# Patient Record
Sex: Female | Born: 2005
Health system: Southern US, Community
[De-identification: ages and names within clinical notes are randomized; demographics above are authoritative.]

---
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
## 2024-04-02 ENCOUNTER — Ambulatory Visit: Admitting: Family Medicine

## 2024-09-02 ENCOUNTER — Telehealth: Payer: Self-pay | Admitting: Family Medicine

## 2024-09-02 NOTE — Telephone Encounter (Signed)
 Copied from CRM #[id number]. Topic: Appointments - Scheduling Inquiry for Clinic >> Sep 02, 2024  2:12 PM Samantha Sherman wrote: Reason for CRM: Burning urine, Lower abdominal pain, Urgency, only pees a little ( Mom called.Samantha Sherman trying to make an appt for patient - she is in college at the moment ) possible UTI?  She won't be in town til Thursday..  Can we schedule?  Pls call mom.

## 2024-09-02 NOTE — Telephone Encounter (Signed)
 Patient scheduled at 3:05 pm 09/04/2024 with Rosaline Bruns, FNP

## 2024-09-04 ENCOUNTER — Ambulatory Visit: Admitting: Family Medicine

## 2024-09-04 ENCOUNTER — Encounter: Payer: Self-pay | Admitting: Family Medicine

## 2024-09-04 ENCOUNTER — Ambulatory Visit: Payer: Self-pay | Admitting: Family Medicine

## 2024-09-04 VITALS — BP 119/83 | HR 87 | Temp 98.3°F | Ht 60.04 in | Wt 132.4 lb

## 2024-09-04 DIAGNOSIS — N3000 Acute cystitis without hematuria: Secondary | ICD-10-CM

## 2024-09-04 DIAGNOSIS — R300 Dysuria: Secondary | ICD-10-CM

## 2024-09-04 DIAGNOSIS — Z23 Encounter for immunization: Secondary | ICD-10-CM | POA: Diagnosis not present

## 2024-09-04 DIAGNOSIS — Z3009 Encounter for other general counseling and advice on contraception: Secondary | ICD-10-CM | POA: Diagnosis not present

## 2024-09-04 LAB — URINALYSIS, ROUTINE W REFLEX MICROSCOPIC
Bilirubin, UA: NEGATIVE
Glucose, UA: NEGATIVE
Ketones, UA: NEGATIVE
Nitrite, UA: NEGATIVE
Protein,UA: NEGATIVE
RBC, UA: NEGATIVE
Specific Gravity, UA: 1.025 (ref 1.005–1.030)
Urobilinogen, Ur: 0.2 mg/dL (ref 0.2–1.0)
pH, UA: 6 (ref 5.0–7.5)

## 2024-09-04 LAB — MICROSCOPIC EXAMINATION
RBC, Urine: NONE SEEN /HPF (ref 0–2)
Renal Epithel, UA: NONE SEEN /HPF
Yeast, UA: NONE SEEN

## 2024-09-04 LAB — PREGNANCY, URINE: Preg Test, Ur: NEGATIVE

## 2024-09-04 MED ORDER — NITROFURANTOIN MONOHYD MACRO 100 MG PO CAPS
100.0000 mg | ORAL_CAPSULE | Freq: Two times a day (BID) | ORAL | 0 refills | Status: AC
Start: 2024-09-04 — End: 2024-09-11

## 2024-09-04 NOTE — Progress Notes (Signed)
 Subjective:  Patient ID: Samantha Sherman, female    DOB: 09/10/2006, 18 y.o.   MRN: [id number]  Patient Care Team: [place] HERO, FNP as PCP - General (Family Medicine)   Chief Complaint:  Dysuria (X 1 week ) and urine odor  (X 1 week )   HPI: Samantha Sherman is a 18 y.o. female presenting on 09/04/2024 for Dysuria (X 1 week ) and urine odor  (X 1 week )   Samantha Sherman is an 18 year old female who presents with urinary symptoms.  She has been experiencing urinary symptoms for a little over a week, including dysuria and a sensation of incomplete bladder emptying. Additionally, she notes malodorous urine. She has not attempted any home remedies such as increasing water intake or consuming cranberry juice.  She has a history of abnormal and heavy menstrual cycles, which may be relevant to her contraceptive needs.  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  No fevers, chills, weakness, confusion, or nausea.       Relevant past medical, surgical, family, and social history reviewed and updated as indicated.  Allergies and medications reviewed and updated. Data reviewed: Chart in Epic.   History reviewed. No pertinent past medical history.  History reviewed. No pertinent surgical history.  Social History   Socioeconomic History   Marital status: Single    Spouse name: Not on file   Number of children: Not on file   Years of education: Not on file   Highest education level: 12th grade  Occupational History   Not on file  Tobacco Use   Smoking status: Never   Smokeless tobacco: Never  Vaping Use   Vaping status: Never Used  Substance and Sexual Activity   Alcohol use: No   Drug use: No   Sexual activity: Not on file  Other Topics Concern   Not on file  Social History Narrative   ** Merged History Encounter **       Social Drivers of Health   Financial Resource Strain: Patient Declined (10/23/2023)   Overall Financial  Resource Strain (CARDIA)    Difficulty of Paying Living Expenses: Patient declined  Food Insecurity: No Food Insecurity (10/23/2023)   Hunger Vital Sign    Worried About Running Out of Food in the Last Year: Never true    Ran Out of Food in the Last Year: Never true  Recent Concern: Food Insecurity - Food Insecurity Present (08/15/2023)   Hunger Vital Sign    Worried About Running Out of Food in the Last Year: Sometimes true    Ran Out of Food in the Last Year: Never true  Transportation Needs: No Transportation Needs (10/23/2023)   PRAPARE - Administrator, Civil Service (Medical): No    Lack of Transportation (Non-Medical): No  Physical Activity: Insufficiently Active (10/23/2023)   Exercise Vital Sign    Days of Exercise per Week: 4 days    Minutes of Exercise per Session: 20 min  Stress: Stress Concern Present (10/23/2023)   [employer] of Occupational Health - Occupational Stress Questionnaire    Feeling of Stress : To some extent  Social Connections: Unknown (10/23/2023)   Social Connection and Isolation Panel    Frequency of Communication with Friends and Family: More than three times a week    Frequency of Social Gatherings with Friends and Family: More than three times a week    Attends Religious Services: More than 4  times per year    Active Member of Clubs or Organizations: No    Attends Banker Meetings: Never    Marital Status: Patient declined  Intimate Partner Violence: Unknown (03/02/2022)   Received from [facility]   HITS    Physically Hurt: Not on file    Insult or Talk Down To: Not on file    Threaten Physical Harm: Not on file    Scream or Curse: Not on file    Outpatient Encounter Medications as of 09/04/2024  Medication Sig   ibuprofen  (ADVIL ) 200 MG tablet Take 200 mg by mouth every 6 (six) hours as needed.   nitrofurantoin , macrocrystal-monohydrate, (MACROBID ) 100 MG capsule Take 1 capsule (100 mg total) by mouth 2 (two)  times daily for 7 days.   [DISCONTINUED] famotidine  (PEPCID ) 20 MG tablet Take 1 tablet (20 mg total) by mouth 2 (two) times daily.   [DISCONTINUED] norethindrone-ethinyl estradiol-FE (HAILEY FE 1/20) 1-20 MG-MCG tablet TAKE ONE TABLET ONCE DAILY   No facility-administered encounter medications on file as of 09/04/2024.    Allergies  Allergen Reactions   Amoxicillin Rash and Hives   Augmentin [Amoxicillin-Pot Clavulanate] Rash    Pertinent ROS per HPI, otherwise unremarkable      Objective:  BP 119/83   Pulse 87   Temp 98.3 F (36.8 C)   Ht 5' 0.04 (1.525 m)   Wt 132 lb 6.4 oz (60.1 kg)   LMP 08/10/2024 (Approximate)   SpO2 99%   BMI 25.82 kg/m    Wt Readings from Last 3 Encounters:  09/04/24 132 lb 6.4 oz (60.1 kg) (62%, Z= 0.30)*  10/24/23 128 lb 8 oz (58.3 kg) (59%, Z= 0.23)*  08/15/23 126 lb 12.8 oz (57.5 kg) (57%, Z= 0.17)*   * Growth percentiles are based on CDC (Girls, 2-20 Years) data.    Physical Exam Vitals and nursing note reviewed.  Constitutional:      General: She is not in acute distress.    Appearance: Normal appearance. She is well-developed, well-groomed and normal weight. She is not Sherman-appearing, toxic-appearing or diaphoretic.  HENT:     Head: Normocephalic and atraumatic.     Jaw: There is normal jaw occlusion.     Right Ear: Hearing normal.     Left Ear: Hearing normal.     Nose: Nose normal.     Mouth/Throat:     Lips: Pink.     Mouth: Mucous membranes are moist.     Pharynx: Uvula midline.  Eyes:     General: Lids are normal.     Pupils: Pupils are equal, round, and reactive to light.  Neck:     Trachea: Trachea and phonation normal.  Cardiovascular:     Rate and Rhythm: Normal rate and regular rhythm.     Heart sounds: Normal heart sounds.  Pulmonary:     Effort: Pulmonary effort is normal. No respiratory distress.     Breath sounds: Normal breath sounds. No wheezing.  Abdominal:     General: Bowel sounds are normal. There is  no distension or abdominal bruit.     Palpations: Abdomen is soft. There is no hepatomegaly or splenomegaly.     Tenderness: There is no abdominal tenderness. There is no right CVA tenderness or left CVA tenderness.     Hernia: No hernia is present.  Musculoskeletal:        General: Normal range of motion.     Cervical back: Normal range of motion and neck supple.  Right lower leg: No edema.     Left lower leg: No edema.  Skin:    General: Skin is warm and dry.     Capillary Refill: Capillary refill takes less than 2 seconds.     Coloration: Skin is not cyanotic, jaundiced or pale.     Findings: No rash.  Neurological:     General: No focal deficit present.     Mental Status: She is alert and oriented to person, place, and time.     Sensory: Sensation is intact.     Motor: Motor function is intact.     Coordination: Coordination is intact.     Gait: Gait is intact.     Deep Tendon Reflexes: Reflexes are normal and symmetric.  Psychiatric:        Attention and Perception: Attention and perception normal.        Mood and Affect: Mood and affect normal.        Speech: Speech normal.        Behavior: Behavior normal. Behavior is cooperative.        Thought Content: Thought content normal.        Cognition and Memory: Cognition and memory normal.        Judgment: Judgment normal.      Results for orders placed or performed in visit on 09/04/24  Microscopic Examination   Collection Time: 09/04/24  3:05 PM   Urine  Result Value Ref Range   WBC, UA 11-30 (A) 0 - 5 /hpf   RBC, Urine None seen 0 - 2 /hpf   Epithelial Cells (non renal) 0-10 0 - 10 /hpf   Renal Epithel, UA None seen None seen /hpf   Bacteria, UA Few (A) None seen/Few   Yeast, UA None seen None seen  Urinalysis, Routine w reflex microscopic   Collection Time: 09/04/24  3:05 PM  Result Value Ref Range   Specific Gravity, UA 1.025 1.005 - 1.030   pH, UA 6.0 5.0 - 7.5   Color, UA Yellow Yellow   Appearance Ur  Clear Clear   Leukocytes,UA 1+ (A) Negative   Protein,UA Negative Negative/Trace   Glucose, UA Negative Negative   Ketones, UA Negative Negative   RBC, UA Negative Negative   Bilirubin, UA Negative Negative   Urobilinogen, Ur 0.2 0.2 - 1.0 mg/dL   Nitrite, UA Negative Negative   Microscopic Examination See below:        Pertinent labs & imaging results that were available during my care of the patient were reviewed by me and considered in my medical decision making.  Assessment & Plan:  Samantha Sherman was seen today for dysuria and urine odor .  Diagnoses and all orders for this visit:  Acute cystitis without hematuria -     Urine Culture -     Urinalysis, Routine w reflex microscopic -     Microscopic Examination -     nitrofurantoin , macrocrystal-monohydrate, (MACROBID ) 100 MG capsule; Take 1 capsule (100 mg total) by mouth 2 (two) times daily for 7 days.  Dysuria -     Urine Culture -     Urinalysis, Routine w reflex microscopic  Birth control counseling -     Pregnancy, urine       Urinary tract infection Urinary tract infection with dysuria, urgency, lower abdominal pain, and malodorous urine for over a week. No fever, chills, weakness, confusion, or nausea. Symptoms suggestive of a bladder infection. No prior treatment attempted. - Prescribe macrobid  for  urinary tract infection  Birth control management Discussion about birth control options, specifically Mirena IUD and Nexplanon. Consideration of menstrual cycle irregularities and lifestyle preferences. Mirena IUD preferred due to potential for amenorrhea, minimal weight gain, and long-term efficacy of eight years. Informed consent includes discussion of insertion discomfort, cramping, and the need for a negative pregnancy test before insertion. Nexplanon not recommended due to potential for irregular cycles and lifestyle impact. - Schedule Mirena IUD insertion with Dr. Maryanne - Perform pregnancy test today and on the  day of Mirena insertion - Provide information on Mirena IUD - Advise taking two Aleve with food one hour before Mirena insertion - Coordinate scheduling with Dr. Williemae nurse for insertion timing          Continue all other maintenance medications.  Follow up plan: Return if symptoms worsen or fail to improve.   Continue healthy lifestyle choices, including diet (rich in fruits, vegetables, and lean proteins, and low in salt and simple carbohydrates) and exercise (at least 30 minutes of moderate physical activity daily).  Educational handout given for Mirena  The above assessment and management plan was discussed with the patient. The patient verbalized understanding of and has agreed to the management plan. Patient is aware to call the clinic if they develop any new symptoms or if symptoms persist or worsen. Patient is aware when to return to the clinic for a follow-up visit. Patient educated on when it is appropriate to go to the emergency department.   Samantha Bruns, FNP-C [facility] [phone]

## 2024-09-08 LAB — URINE CULTURE

## 2024-09-18 ENCOUNTER — Encounter: Payer: Self-pay | Admitting: Family Medicine

## 2024-09-19 ENCOUNTER — Ambulatory Visit: Admitting: Family Medicine

## 2024-10-17 ENCOUNTER — Ambulatory Visit: Payer: Self-pay | Admitting: Family Medicine

## 2024-10-17 ENCOUNTER — Encounter: Payer: Self-pay | Admitting: Family Medicine

## 2024-10-17 VITALS — BP 116/76 | HR 104 | Temp 98.4°F | Ht 61.0 in | Wt 131.0 lb

## 2024-10-17 DIAGNOSIS — Z30430 Encounter for insertion of intrauterine contraceptive device: Secondary | ICD-10-CM | POA: Diagnosis not present

## 2024-10-17 DIAGNOSIS — Z0000 Encounter for general adult medical examination without abnormal findings: Secondary | ICD-10-CM | POA: Diagnosis not present

## 2024-10-17 MED ORDER — LEVONORGESTREL 20 MCG/DAY IU IUD
1.0000 | INTRAUTERINE_SYSTEM | Freq: Once | INTRAUTERINE | Status: AC
Start: 2024-10-17 — End: 2024-10-17
  Administered 2024-10-17: 1 via INTRAUTERINE

## 2024-10-17 NOTE — Progress Notes (Unsigned)
 BP 116/76   Pulse (!) 104   Temp 98.4 F (36.9 C)   Ht 5' 1 (1.549 m)   Wt 131 lb (59.4 kg)   SpO2 97%   BMI 24.75 kg/m    Subjective:   Patient ID: Samantha Sherman, female    DOB: [birth year], 18 y.o.   MRN: [id number]  HPI: Samantha Sherman is a 18 y.o. female presenting on 10/17/2024 for Mirena  Insertion   Discussed the use of AI scribe software for clinical note transcription with the patient, who gave verbal consent to proceed.  History of Present Illness       Patient is coming in today for physical exam and IUD placement for birth control.  She does have heavy menstrual cycles with a lot of dysmenorrhea and pain and cramping.  She wants it for period control but she does admit that she is sexually active with her current partner he is a female that has been sexually active over the past year.  She is using condoms most of the time but not all of the time with her partner.  She has had regular menstrual cycles.  She did have a urine pregnancy screen that was negative a couple weeks ago and then a urine pregnancy that was negative today.  She denies any other health issues.  She denies any vaginal issues or discharge      Relevant past medical, surgical, family and social history reviewed and updated as indicated. Interim medical history since our last visit reviewed. Allergies and medications reviewed and updated.  Review of Systems  Constitutional:  Negative for chills and fever.  HENT:  Negative for ear pain and tinnitus.   Eyes:  Negative for pain.  Respiratory:  Negative for cough, shortness of breath and wheezing.   Cardiovascular:  Negative for chest pain, palpitations and leg swelling.  Gastrointestinal:  Negative for abdominal pain, blood in stool, constipation and diarrhea.  Genitourinary:  Positive for menstrual problem. Negative for dysuria, hematuria, vaginal bleeding, vaginal discharge and vaginal pain.  Musculoskeletal:  Negative for back pain and myalgias.   Skin:  Negative for rash.  Neurological:  Negative for dizziness, weakness and headaches.  Psychiatric/Behavioral:  Negative for suicidal ideas.     Per HPI unless specifically indicated above   Allergies as of 10/17/2024       Reactions   Amoxicillin Rash, Hives   Augmentin [amoxicillin-pot Clavulanate] Rash        Medication List        Accurate as of October 17, 2024 11:59 PM. If you have any questions, ask your nurse or doctor.          ibuprofen  200 MG tablet Commonly known as: ADVIL  Take 200 mg by mouth every 6 (six) hours as needed.         Objective:   BP 116/76   Pulse (!) 104   Temp 98.4 F (36.9 C)   Ht 5' 1 (1.549 m)   Wt 131 lb (59.4 kg)   SpO2 97%   BMI 24.75 kg/m   Wt Readings from Last 3 Encounters:  10/17/24 131 lb (59.4 kg) (59%, Z= 0.23)*  09/04/24 132 lb 6.4 oz (60.1 kg) (62%, Z= 0.30)*  10/24/23 128 lb 8 oz (58.3 kg) (59%, Z= 0.23)*   * Growth percentiles are based on CDC (Girls, 2-20 Years) data.    Physical Exam Vitals and nursing note reviewed. Exam conducted with a chaperone present.  Constitutional:  General: She is not in acute distress.    Appearance: She is well-developed. She is not diaphoretic.  Eyes:     Conjunctiva/sclera: Conjunctivae normal.  Neck:     Thyroid: No thyromegaly.  Cardiovascular:     Rate and Rhythm: Normal rate and regular rhythm.     Heart sounds: Normal heart sounds. No murmur heard. Pulmonary:     Effort: Pulmonary effort is normal. No respiratory distress.     Breath sounds: Normal breath sounds. No wheezing.  Chest:  Breasts:    Breasts are symmetrical.     Right: No inverted nipple, mass, nipple discharge, skin change or tenderness.     Left: No inverted nipple, mass, nipple discharge, skin change or tenderness.  Abdominal:     General: Bowel sounds are normal. There is no distension.     Palpations: Abdomen is soft.     Tenderness: There is no abdominal tenderness. There is no  guarding or rebound.  Genitourinary:    Exam position: Supine.     Labia:        Right: No rash or lesion.        Left: No rash or lesion.      Vagina: Normal.     Cervix: No cervical motion tenderness, discharge or friability.     Uterus: Not deviated, not enlarged, not fixed and not tender.      Adnexa:        Right: No mass or tenderness.         Left: No mass or tenderness.    Musculoskeletal:        General: Normal range of motion.     Cervical back: Neck supple.  Lymphadenopathy:     Cervical: No cervical adenopathy.  Skin:    General: Skin is warm and dry.     Findings: No rash.  Neurological:     Mental Status: She is alert and oriented to person, place, and time.     Coordination: Coordination normal.  Psychiatric:        Behavior: Behavior normal.             IUD insertion procedure: Patient was placed in stirrups and use a speculum. Patient was prepped with Betadine swabs using cotton balls. Tenaculum was used to grab the anterior cervix. Uterine sound was performed and found to be 7.5 cm in length and anteverted. Cervical dilation was necessary and use the smallest dilator. Mirena  was placed using factory device at the correct depth that was measured and deployed without issue. The strings were cut leaving 1.5 cm extra. Patient tolerated procedure well and bleeding was minimal.     Assessment & Plan:   Problem List Items Addressed This Visit   None Visit Diagnoses       Physical exam    -  Primary     Encounter for IUD insertion       Relevant Medications   levonorgestrel  (MIRENA ) 20 MCG/DAY IUD 1 each (Completed)   Other Relevant Orders   Pregnancy, urine                      Follow up plan: Return if symptoms worsen or fail to improve, for 4-week IUD recheck.  Counseling provided for all of the vaccine components Orders Placed This Encounter  Procedures   Pregnancy, urine    Fonda Levins, MD [facility] 10/20/2024, 7:42 AM

## 2024-10-20 LAB — PREGNANCY, URINE: Preg Test, Ur: NEGATIVE

## 2024-10-28 ENCOUNTER — Encounter: Payer: Self-pay | Admitting: Family Medicine

## 2024-11-13 ENCOUNTER — Ambulatory Visit: Admitting: Family Medicine

## 2024-11-13 ENCOUNTER — Encounter: Payer: Self-pay | Admitting: Family Medicine

## 2024-11-13 VITALS — BP 117/79 | HR 79 | Temp 97.6°F | Ht 61.0 in | Wt 131.4 lb

## 2024-11-13 DIAGNOSIS — H9202 Otalgia, left ear: Secondary | ICD-10-CM | POA: Diagnosis not present

## 2024-11-13 DIAGNOSIS — J358 Other chronic diseases of tonsils and adenoids: Secondary | ICD-10-CM

## 2024-11-13 DIAGNOSIS — R051 Acute cough: Secondary | ICD-10-CM | POA: Diagnosis not present

## 2024-11-13 DIAGNOSIS — R0981 Nasal congestion: Secondary | ICD-10-CM | POA: Diagnosis not present

## 2024-11-13 DIAGNOSIS — J069 Acute upper respiratory infection, unspecified: Secondary | ICD-10-CM

## 2024-11-13 DIAGNOSIS — R519 Headache, unspecified: Secondary | ICD-10-CM | POA: Diagnosis not present

## 2024-11-13 LAB — VERITOR SARS-COV-2 AND FLU A+B
BD Veritor SARS-CoV-2 Ag: NEGATIVE
Influenza A: NEGATIVE
Influenza B: NEGATIVE

## 2024-11-13 MED ORDER — CHLORPHEN-PE-ACETAMINOPHEN 4-10-325 MG PO TABS: 1.0000 | ORAL_TABLET | Freq: Four times a day (QID) | ORAL | 0 refills | Status: AC | PRN

## 2024-11-13 NOTE — Progress Notes (Signed)
 Subjective:  Patient ID: Samantha Sherman, female    DOB: 25-Oct-2006, 18 y.o.   MRN: [id number]  Patient Care Team: Severa [place] HERO, FNP as PCP - General (Family Medicine)   Chief Complaint:  Ear Pain (Left x 2 days ), Nasal Congestion, Headache (/), and Cough (X 2 days )   HPI: Samantha Sherman is a 18 y.o. female presenting on 11/13/2024 for Ear Pain (Left x 2 days ), Nasal Congestion, Headache (/), and Cough (X 2 days )   Samantha Sherman is an 18 year old female who presents with ear pain and headache.  Two days ago, they began experiencing a sore ear, primarily in the left ear. A family member noted a change in the ear's appearance. They typically maintain clean ears with assistance from the family member.  They have also been experiencing a headache for the past two days, located behind the eyes and described as frontal. This morning, they noted soreness in their eyes, although the headache had subsided.  They have been taking off-brand DayQuil and NyQuil to manage their symptoms. Their temperature was recorded at 99 degrees Fahrenheit yesterday, but they have not experienced significant fever, chills, or body aches. No chills, body aches, or significant fever.          Relevant past medical, surgical, family, and social history reviewed and updated as indicated.  Allergies and medications reviewed and updated. Data reviewed: Chart in Epic.   History reviewed. No pertinent past medical history.  History reviewed. No pertinent surgical history.  Social History   Socioeconomic History   Marital status: Single    Spouse name: Not on file   Number of children: Not on file   Years of education: Not on file   Highest education level: Associate degree: academic program  Occupational History   Not on file  Tobacco Use   Smoking status: Never   Smokeless tobacco: Never  Vaping Use   Vaping status: Never Used  Substance and Sexual Activity   Alcohol use: No   Drug use:  No   Sexual activity: Not on file  Other Topics Concern   Not on file  Social History Narrative   ** Merged History Encounter **       Social Drivers of Health   Tobacco Use: Low Risk (11/13/2024)   Patient History    Smoking Tobacco Use: Never    Smokeless Tobacco Use: Never    Passive Exposure: Not on file  Financial Resource Strain: Low Risk (09/15/2024)   Overall Financial Resource Strain (CARDIA)    Difficulty of Paying Living Expenses: Not hard at all  Food Insecurity: No Food Insecurity (09/15/2024)   Epic    Worried About Programme Researcher, Broadcasting/film/video in the Last Year: Never true    Ran Out of Food in the Last Year: Never true  Transportation Needs: No Transportation Needs (09/15/2024)   Epic    Lack of Transportation (Medical): No    Lack of Transportation (Non-Medical): No  Physical Activity: Sufficiently Active (09/15/2024)   Exercise Vital Sign    Days of Exercise per Week: 5 days    Minutes of Exercise per Session: 30 min  Stress: No Stress Concern Present (09/15/2024)   [employer] of Occupational Health - Occupational Stress Questionnaire    Feeling of Stress: Not at all  Social Connections: Moderately Isolated (09/15/2024)   Social Connection and Isolation Panel    Frequency of Communication with Friends and Family: More than three  times a week    Frequency of Social Gatherings with Friends and Family: Once a week    Attends Religious Services: 1 to 4 times per year    Active Member of [employer] or Organizations: No    Attends Engineer, Structural: Not on file    Marital Status: Never married  Intimate Partner Violence: Unknown (03/02/2022)   Received from [facility]   HITS    Physically Hurt: Not on file    Insult or Talk Down To: Not on file    Threaten Physical Harm: Not on file    Scream or Curse: Not on file  Depression (PHQ2-9): Low Risk (11/13/2024)   Depression (PHQ2-9)    PHQ-2 Score: 4  Alcohol Screen: Low Risk (09/15/2024)   Alcohol  Screen    Last Alcohol Screening Score (AUDIT): 5  Housing: Unknown (09/15/2024)   Epic    Unable to Pay for Housing in the Last Year: No    Number of Times Moved in the Last Year: Not on file    Homeless in the Last Year: No  Utilities: Not on file  Health Literacy: Not on file    Outpatient Encounter Medications as of 11/13/2024  Medication Sig   Chlorphen-PE-Acetaminophen  4-10-325 MG TABS Take 1 tablet by mouth every 6 (six) hours as needed.   fluticasone (FLONASE) 50 MCG/ACT nasal spray Place 2 sprays into both nostrils daily.   ibuprofen  (ADVIL ) 200 MG tablet Take 200 mg by mouth every 6 (six) hours as needed.   No facility-administered encounter medications on file as of 11/13/2024.    Allergies[1]  Pertinent ROS per HPI, otherwise unremarkable      Objective:  BP 117/79   Pulse 79   Temp 97.6 F (36.4 C)   Ht 5' 1 (1.549 m)   Wt 131 lb 6.4 oz (59.6 kg)   SpO2 98%   BMI 24.83 kg/m    Wt Readings from Last 3 Encounters:  11/13/24 131 lb 6.4 oz (59.6 kg) (59%, Z= 0.24)*  10/17/24 131 lb (59.4 kg) (59%, Z= 0.23)*  09/04/24 132 lb 6.4 oz (60.1 kg) (62%, Z= 0.30)*   * Growth percentiles are based on CDC (Girls, 2-20 Years) data.    Physical Exam Vitals and nursing note reviewed.  Constitutional:      General: She is not in acute distress.    Appearance: Normal appearance. She is well-developed and normal weight. She is not Sherman-appearing, toxic-appearing or diaphoretic.  HENT:     Head: Normocephalic and atraumatic.     Right Ear: Ear canal and external ear normal. A middle ear effusion is present. Tympanic membrane is not erythematous.     Left Ear: Ear canal and external ear normal. A middle ear effusion is present. Tympanic membrane is not erythematous.     Nose: Congestion present.     Right Turbinates: Enlarged.     Left Turbinates: Enlarged.     Mouth/Throat:     Lips: Pink.     Mouth: Mucous membranes are moist.     Pharynx: Posterior oropharyngeal  erythema and postnasal drip present. No oropharyngeal exudate.   Eyes:     Conjunctiva/sclera: Conjunctivae normal.     Pupils: Pupils are equal, round, and reactive to light.  Cardiovascular:     Rate and Rhythm: Normal rate and regular rhythm.     Heart sounds: Normal heart sounds.  Pulmonary:     Effort: Pulmonary effort is normal.     Breath sounds:  Normal breath sounds.  Musculoskeletal:     Cervical back: Neck supple.     Right lower leg: No edema.     Left lower leg: No edema.  Lymphadenopathy:     Cervical: No cervical adenopathy.  Skin:    General: Skin is warm and dry.     Capillary Refill: Capillary refill takes less than 2 seconds.  Neurological:     General: No focal deficit present.     Mental Status: She is alert and oriented to person, place, and time.  Psychiatric:        Mood and Affect: Mood normal.        Behavior: Behavior normal. Behavior is cooperative.        Thought Content: Thought content normal.        Judgment: Judgment normal.      Results for orders placed or performed in visit on 11/13/24  Veritor SARS-CoV-2 and Flu A+B   Collection Time: 11/13/24  4:02 PM   Specimen: Nasal Swab   Nasal Swab  Result Value Ref Range   Influenza A Negative Negative   Influenza B Negative Negative   BD Veritor SARS-CoV-2 Ag Negative Negative       Pertinent labs & imaging results that were available during my care of the patient were reviewed by me and considered in my medical decision making.  Assessment & Plan:  Haylen was seen today for ear pain, nasal congestion, headache and cough.  Diagnoses and all orders for this visit:  Left ear pain  Nasal congestion  Acute cough -     Veritor SARS-CoV-2 and Flu A+B  Frontal headache  Tonsil stone  URI with cough and congestion -     fluticasone (FLONASE) 50 MCG/ACT nasal spray; Place 2 sprays into both nostrils daily. -     Chlorphen-PE-Acetaminophen  4-10-325 MG TABS; Take 1 tablet by mouth  every 6 (six) hours as needed.      Viral upper respiratory infection Presents with ear pain, headache, and sore eyes for two days. Examination reveals fluid behind the left ear, with no signs of bacterial infection. COVID-19 and influenza tests are negative. Symptoms are consistent with a viral etiology, common during this season, with no significant fever or bacterial signs. - Prescribe Flonase for nasal congestion. - Prescribe Neoral, a combination cough decongestant and antihistamine containing acetaminophen , to address headache, cough, congestion, and rhinorrhea. - Advise to monitor symptoms and report if a high fever develops or if symptoms worsen over the next five days, at which point antibiotics may be considered.          Continue all other maintenance medications.  Follow up plan: Return if symptoms worsen or fail to improve.   Continue healthy lifestyle choices, including diet (rich in fruits, vegetables, and lean proteins, and low in salt and simple carbohydrates) and exercise (at least 30 minutes of moderate physical activity daily).    The above assessment and management plan was discussed with the patient. The patient verbalized understanding of and has agreed to the management plan. Patient is aware to call the clinic if they develop any new symptoms or if symptoms persist or worsen. Patient is aware when to return to the clinic for a follow-up visit. Patient educated on when it is appropriate to go to the emergency department.   Samantha Bruns, FNP-C [facility] [phone]     [1]  Allergies Allergen Reactions   Amoxicillin Rash and Hives   Augmentin [Amoxicillin-Pot Clavulanate]  Rash

## 2024-11-24 ENCOUNTER — Ambulatory Visit: Admitting: Family Medicine

## 2024-11-24 ENCOUNTER — Encounter: Payer: Self-pay | Admitting: Family Medicine

## 2024-11-24 VITALS — BP 117/79 | HR 98 | Ht 61.0 in | Wt 131.0 lb

## 2024-11-24 DIAGNOSIS — Z975 Presence of (intrauterine) contraceptive device: Secondary | ICD-10-CM

## 2024-11-24 DIAGNOSIS — J069 Acute upper respiratory infection, unspecified: Secondary | ICD-10-CM | POA: Diagnosis not present

## 2024-11-24 NOTE — Progress Notes (Signed)
 "  BP 117/79   Pulse 98   Ht 5' 1 (1.549 m)   Wt 131 lb (59.4 kg)   SpO2 100%   BMI 24.75 kg/m    Subjective:   Patient ID: Samantha Sherman, female    DOB: Jan 29, 2006, 18 y.o.   MRN: [id number]  HPI: Samantha Sherman is a 18 y.o. female presenting on 11/24/2024 for Mirena  placement check   Discussed the use of AI scribe software for clinical note transcription with the patient, who gave verbal consent to proceed.  History of Present Illness   Samantha Sherman is a 18 year old female who presents for follow-up after IUD placement and evaluation of persistent cough and congestion.  Post-iud placement symptoms - IUD placed approximately one month ago - Initial spotting after placement, now resolved - One day of heavy bleeding, likely menstrual period, followed by two days of light bleeding - No pain, problems, or abnormal vaginal discharge - Unable to feel IUD strings herself  Respiratory symptoms - Congestion and cough present for over one week - Productive cough with green sputum - No fever or chills - Tested negative for influenza and COVID-19 during prior evaluation - Symptoms are improving but not fully resolved          Relevant past medical, surgical, family and social history reviewed and updated as indicated. Interim medical history since our last visit reviewed. Allergies and medications reviewed and updated.  Review of Systems  Constitutional:  Negative for chills and fever.  HENT:  Positive for congestion. Negative for ear discharge, ear pain, postnasal drip, rhinorrhea, sinus pressure, sneezing and sore throat.   Eyes:  Negative for pain, redness and visual disturbance.  Respiratory:  Positive for cough. Negative for chest tightness, shortness of breath and wheezing.   Cardiovascular:  Negative for chest pain and leg swelling.  Genitourinary:  Negative for difficulty urinating and dysuria.  Musculoskeletal:  Negative for back pain and gait problem.  Skin:   Negative for rash.  Neurological:  Negative for light-headedness and headaches.  Psychiatric/Behavioral:  Negative for agitation and behavioral problems.   All other systems reviewed and are negative.   Per HPI unless specifically indicated above   Allergies as of 11/24/2024       Reactions   Amoxicillin Rash, Hives   Augmentin [amoxicillin-pot Clavulanate] Rash        Medication List        Accurate as of November 24, 2024  9:28 AM. If you have any questions, ask your nurse or doctor.          Chlorphen-PE-Acetaminophen  4-10-325 MG Tabs Take 1 tablet by mouth every 6 (six) hours as needed.   fluticasone 50 MCG/ACT nasal spray Commonly known as: FLONASE Place 2 sprays into both nostrils daily.   ibuprofen  200 MG tablet Commonly known as: ADVIL  Take 200 mg by mouth every 6 (six) hours as needed.   levonorgestrel  20 MCG/DAY Iud Commonly known as: MIRENA  1 each by Intrauterine route once. Placed 10/17/24         Objective:   BP 117/79   Pulse 98   Ht 5' 1 (1.549 m)   Wt 131 lb (59.4 kg)   SpO2 100%   BMI 24.75 kg/m   Wt Readings from Last 3 Encounters:  11/24/24 131 lb (59.4 kg) (59%, Z= 0.22)*  11/13/24 131 lb 6.4 oz (59.6 kg) (59%, Z= 0.24)*  10/17/24 131 lb (59.4 kg) (59%, Z= 0.23)*   * Growth percentiles are based  on CDC (Girls, 2-20 Years) data.    Physical Exam Vitals and nursing note reviewed. Exam conducted with a chaperone present.  Constitutional:      General: She is not in acute distress.    Appearance: She is well-developed. She is not diaphoretic.  Eyes:     Conjunctiva/sclera: Conjunctivae normal.  Cardiovascular:     Rate and Rhythm: Normal rate and regular rhythm.     Heart sounds: Normal heart sounds. No murmur heard. Pulmonary:     Effort: Pulmonary effort is normal. No respiratory distress.     Breath sounds: Normal breath sounds. No wheezing, rhonchi or rales.  Genitourinary:    General: Normal vulva.     Exam position:  Lithotomy position.     Labia:        Right: No rash or tenderness.        Left: No rash or tenderness.      Vagina: Normal. No signs of injury. No vaginal discharge or tenderness.     Cervix: Normal. No cervical motion tenderness, discharge or friability.     Uterus: Normal. Not deviated, not enlarged, not fixed and not tender.      Adnexa: Right adnexa normal and left adnexa normal.     Skin:    General: Skin is warm and dry.     Findings: No rash.  Neurological:     Mental Status: She is alert and oriented to person, place, and time.     Coordination: Coordination normal.  Psychiatric:        Behavior: Behavior normal.    Physical Exam   CHEST: Lungs clear to auscultation bilaterally         Assessment & Plan:   Problem List Items Addressed This Visit   None Visit Diagnoses       IUD (intrauterine device) in place    -  Primary     Upper respiratory tract infection, unspecified type             Intrauterine device management IUD in place for one month with resolved initial spotting. Recent heavy menstrual bleeding followed by minimal bleeding. No pain or discharge. Backup contraception no longer needed. Explained potential for minimal or no cycles with IUD use. Advised on self-checking IUD strings and when to seek medical attention for heavy bleeding or clots. Discussed potential need for oral contraceptive suppression if spotting persists. - Advised on self-checking IUD strings if comfortable. - Instructed to return if experiencing heavy bleeding, clots, or concerns about IUD placement. - Discussed potential use of oral contraceptive suppression if spotting persists.  Acute upper respiratory infection Congestion and cough persisting for over a week. Negative for flu and COVID. Lungs clear, indicating likely upper respiratory drainage. Discussed option of antibiotics versus natural resolution. Explained potential side effects of antibiotics. - Advised saline nasal  rinses and saltwater gargles. - Recommended use of Flonase before bedtime. - Suggested use of a humidifier in the room. - Consider adding Benadryl at night if symptoms persist. - Monitor symptoms and consider antibiotics if no improvement in a week.  General health maintenance Discussed importance of yearly physicals and Pap smears. - Schedule yearly physical and Pap smear after November 2025.          Follow up plan: Return in about 1 year (around 11/24/2025), or if symptoms worsen or fail to improve, for Physical exam.  Counseling provided for all of the vaccine components No orders of the defined types were placed in this  encounter.   Fonda Levins, MD [facility] 11/24/2024, 9:28 AM     "
